# Patient Record
Sex: Male | Born: 1975 | Race: White | Hispanic: Yes | Marital: Single | Smoking: Never smoker
Health system: Southern US, Community
[De-identification: ages and names within clinical notes are randomized; demographics above are authoritative.]

## PROBLEM LIST (undated history)

## (undated) DIAGNOSIS — K219 Gastro-esophageal reflux disease without esophagitis: Secondary | ICD-10-CM

## (undated) HISTORY — DX: Gastro-esophageal reflux disease without esophagitis: K21.9

---

## 2005-11-04 ENCOUNTER — Ambulatory Visit: Payer: Self-pay | Admitting: Nurse Practitioner

## 2005-11-05 ENCOUNTER — Ambulatory Visit: Payer: Self-pay | Admitting: *Deleted

## 2005-11-15 ENCOUNTER — Emergency Department (HOSPITAL_COMMUNITY): Admission: EM | Admit: 2005-11-15 | Discharge: 2005-11-15 | Payer: Self-pay | Admitting: Emergency Medicine

## 2015-12-09 ENCOUNTER — Emergency Department (HOSPITAL_COMMUNITY): Payer: Self-pay

## 2015-12-09 ENCOUNTER — Encounter (HOSPITAL_COMMUNITY): Payer: Self-pay | Admitting: Family Medicine

## 2015-12-09 ENCOUNTER — Emergency Department (HOSPITAL_COMMUNITY)
Admission: EM | Admit: 2015-12-09 | Discharge: 2015-12-09 | Disposition: A | Discharge status: Home / Self Care | Payer: Self-pay | Attending: Emergency Medicine | Admitting: Emergency Medicine

## 2015-12-09 DIAGNOSIS — Y9289 Other specified places as the place of occurrence of the external cause: Secondary | ICD-10-CM | POA: Insufficient documentation

## 2015-12-09 DIAGNOSIS — W11XXXA Fall on and from ladder, initial encounter: Secondary | ICD-10-CM | POA: Insufficient documentation

## 2015-12-09 DIAGNOSIS — S92001A Unspecified fracture of right calcaneus, initial encounter for closed fracture: Secondary | ICD-10-CM | POA: Insufficient documentation

## 2015-12-09 DIAGNOSIS — S3992XA Unspecified injury of lower back, initial encounter: Secondary | ICD-10-CM | POA: Insufficient documentation

## 2015-12-09 DIAGNOSIS — R110 Nausea: Secondary | ICD-10-CM | POA: Insufficient documentation

## 2015-12-09 DIAGNOSIS — Y998 Other external cause status: Secondary | ICD-10-CM | POA: Insufficient documentation

## 2015-12-09 DIAGNOSIS — Y9389 Activity, other specified: Secondary | ICD-10-CM | POA: Insufficient documentation

## 2015-12-09 MED ORDER — OXYCODONE-ACETAMINOPHEN 5-325 MG PO TABS: 1.0000 | ORAL_TABLET | Freq: Four times a day (QID) | ORAL | Status: AC | PRN

## 2015-12-09 MED ORDER — ONDANSETRON 4 MG PO TBDP
4.0000 mg | ORAL_TABLET | Freq: Once | ORAL | Status: AC
  Administered 2015-12-09: 4 mg via ORAL
  Filled 2015-12-09: qty 1

## 2015-12-09 MED ORDER — OXYCODONE-ACETAMINOPHEN 5-325 MG PO TABS
1.0000 | ORAL_TABLET | Freq: Once | ORAL | Status: AC
  Administered 2015-12-09: 1 via ORAL
  Filled 2015-12-09: qty 1

## 2015-12-09 NOTE — ED Notes (Signed)
Pt here for fall from ladder about 10 feet landing on right leg. Pt here with ankle pain and swelling.sts also foot pain

## 2015-12-09 NOTE — Progress Notes (Signed)
Orthopedic Tech Progress Note Patient Details:  Charles Richardson 06/10/1976 [id number]  Ortho Devices Type of Ortho Device: Ace wrap, Post (short leg) splint, Crutches Ortho Device/Splint Interventions: Application   Saul FordyceJennifer C Tyron Manetta 12/09/2015, 4:23 PM

## 2015-12-09 NOTE — ED Notes (Signed)
Declined W/C at D/C and was escorted to lobby by RN. 

## 2015-12-09 NOTE — Discharge Instructions (Signed)
DO NOT PUT ANY WEIGHT ON YOUR FOOT! No walking on foot at all. Call the orthopedic physician listed first thing Monday morning to schedule your follow up appointment. Use pain medication as needed for severe pain - This can make you very drowsy - please do not drink or drive on this medication.

## 2015-12-09 NOTE — ED Provider Notes (Signed)
CSN: [id number]     Arrival date & time 12/09/15  1233 History  By signing my name below, I, Charles Richardson, attest that this documentation has been prepared under the direction and in the presence of [facility], PA-C. Electronically Signed: Placido Richardson, ED Scribe. 12/09/2015. 4:36 PM.   Chief Complaint  Patient presents with  . Fall  . Leg Injury   The history is provided by the patient and a relative. No language interpreter was used.    HPI Comments: Charles Richardson is a 40 y.o. male who is otherwise healthy presents to the Emergency Department complaining of a fall that occurred PTA. Pt was on a ladder and fell ~10 feet landing on his feet (mostly right foot) with resulting constant, moderate, right foot and ankle pain, mild nausea, mild lower back pain as well as mild left ankle pain which he states has since alleviated. His pain worsens with movement or palpation. He notes taking 6x OTC tylenol which provided short term pain relief. He denies numbness, right knee pain or any other associated symptoms at this time.   History reviewed. No pertinent past medical history. History reviewed. No pertinent past surgical history. History reviewed. No pertinent family history. Social History  Substance Use Topics  . Smoking status: Never Smoker   . Smokeless tobacco: None  . Alcohol Use: None    Review of Systems  Constitutional: Negative for fatigue.  Eyes: Negative for visual disturbance.  Respiratory: Negative for shortness of breath.   Cardiovascular: Negative for chest pain.  Gastrointestinal: Positive for nausea. Negative for vomiting and abdominal pain.  Genitourinary: Negative for difficulty urinating.  Musculoskeletal: Positive for back pain, joint swelling and arthralgias.  Skin: Negative for pallor.  Allergic/Immunologic: Negative for immunocompromised state.  Neurological: Negative for syncope and numbness.   Allergies  Review of patient's allergies indicates no  known allergies.  Home Medications   Prior to Admission medications   Medication Sig Start Date End Date Taking? Authorizing Provider  oxyCODONE-acetaminophen (PERCOCET/ROXICET) 5-325 MG tablet Take 1-2 tablets by mouth every 6 (six) hours as needed for severe pain. 12/09/15   Charles Pilcher Ward, PA-C   BP 115/77 mmHg  Pulse 63  Temp(Src) 97.7 F (36.5 C) (Oral)  Resp 20  SpO2 99%    Physical Exam  Constitutional: He is oriented to person, place, and time. He appears well-developed and well-nourished.  HENT:  Head: Normocephalic and atraumatic.  Eyes: EOM are normal.  Neck: Normal range of motion.  Cardiovascular: Normal rate, regular rhythm and normal heart sounds.  Exam reveals no gallop and no friction rub.   No murmur heard. Pulmonary/Chest: Effort normal and breath sounds normal. No respiratory distress. He has no wheezes. He has no rales.  Abdominal: Soft. He exhibits no distension. There is no tenderness.  Musculoskeletal:       Feet:  Right ankle with significant generalized swelling. No open wound. TTP as depicted in image. Decreased ROM 2/2 pain. Sensation intact and 2+ distal pulse.  Mild tenderness to palpation across low back. Paraspinal > midline. No overlying skin changes.   Neurological: He is alert and oriented to person, place, and time.  Skin: Skin is warm and dry.  Psychiatric: He has a normal mood and affect.  Nursing note and vitals reviewed.   ED Course  Procedures  DIAGNOSTIC STUDIES: Oxygen Saturation is 99% on RA, normal by my interpretation.    COORDINATION OF CARE: 1:39 PM Discussed next steps with pt. He verbalized understanding  and is agreeable with the plan.   Labs Review Labs Reviewed - No data to display  Imaging Review Dg Lumbar Spine Complete  12/09/2015  CLINICAL DATA:  Pt was painting and on a ladder and fell APPROX.10 feet landing on his feet with resulting Mild to lower lumbar back pain Initial encounter. EXAM: LUMBAR SPINE -  COMPLETE 4+ VIEW COMPARISON:  None. FINDINGS: There is no evidence of lumbar spine fracture. Alignment is normal. Intervertebral disc spaces are maintained. No evidence of facet arthropathy or other bone lesions. IMPRESSION: Negative lumbar spine radiographs. Electronically Signed   By: Myles RosenthalJohn  Stahl M.D.   On: 12/09/2015 14:40   Dg Ankle Complete Right  12/09/2015  CLINICAL DATA:  40 year old male status post fall from a ladder onto his heel (approximately 10 feet). EXAM: RIGHT ANKLE - COMPLETE 3+ VIEW COMPARISON:  Concurrently obtained radiographs of the right foot FINDINGS: Extensive soft tissue swelling about the ankle. No evidence of acute fracture involving the distal tibia, fibula or talus. However, there is a complex and slightly impacted fracture of the calcaneus. The fracture line extends to the subtalar joint articular surface. No significant displacement. No ankle joint effusion. IMPRESSION: Comminuted and slightly impacted calcaneal fracture with extension to the subtalar joint articular surface. Electronically Signed   By: Malachy MoanHeath  McCullough M.D.   On: 12/09/2015 13:25   Ct Foot Right Wo Contrast  12/09/2015  CLINICAL DATA:  Status post 10 foot fall from a ladder today with a right calcaneus fracture. Initial encounter. EXAM: CT OF THE RIGHT FOOT WITHOUT CONTRAST TECHNIQUE: Multidetector CT imaging of the right foot was performed according to the standard protocol. Multiplanar CT image reconstructions were also generated. COMPARISON:  Plain films the right ankle earlier today. FINDINGS: The patient has a comminuted fracture of the calcaneus. The main fracture line extends from the dorsal calcaneus posterior to the subtalar joint in an inferior and oblique orientation through the midbody of the calcaneus. This component of the fracture is distracted approximately 1.2 cm. The lateral cortex of the body of the calcaneus is buckled. Only the inferior most margin of the posterior facet is involved in the  fracture. The bulk of the posterior facet is preserved. The sustentacular tali is a separate fragment and a fracture line extends to the mid substance of the middle facet with depression of the articular surface of approximately 0.2 cm. The lateral margin of the middle facet is widened. The fracture also extends to the medial periphery of the calcaneocuboid joint. The anterior process of the calcaneus is a separate fragment. No other fracture is identified. Bones are osteopenic. Extensive hematoma is present about the ankle. The peroneus longus tendon passes immediately adjacent to the lateral fracture of the calcaneus but no definite entrapment is seen. No obvious ligament tear seen. IMPRESSION: Comminuted calcaneus fracture does not fit into the [employer] classification as only a minimal portion of the posterior facet of the subtalar joint is involved. The fracture does extend through the articular surface of the middle facet and the sustentaculum and anterior process of the calcaneus are separate fragments. The peroneus longus passes along the fracture line through the lateral aspect of the calcaneus but does not definitely appear entrapped. Electronically Signed   By: Drusilla Kannerhomas  Dalessio M.D.   On: 12/09/2015 16:21   Dg Foot Complete Right  12/09/2015  CLINICAL DATA:  40 year old male status post fall from 10 foot ladder onto his heel EXAM: RIGHT FOOT COMPLETE - 3+ VIEW COMPARISON:  Concurrently obtained  radiographs of the ankle FINDINGS: Comminuted and slightly impacted calcaneal fracture. Fracture line extends to the subtalar joint surface. The surrounding bones appear intact. No significant displacement. IMPRESSION: Comminuted and slightly impacted calcaneal fracture with extension to the subtalar articular surface. Electronically Signed   By: Malachy Moan M.D.   On: 12/09/2015 13:27   I have personally reviewed and evaluated these images as part of my medical decision-making.   EKG  Interpretation None      MDM   Final diagnoses:  Calcaneal fracture, right, closed, initial encounter   Jencarlo Bonadonna presents to ED after fall from approx. 10 ft ladder landing on feet. Main complaint at this time is right foot pain. X-rays obtained which show a comminuted and slightly impacted calcaneal fracture. Lumbar imaging also obtained which was unremarkable. Consulted ortho, Dr. Linna Caprice, who recommends obtaining a CT of the foot. Place in a short leg posterior splint and follow up with Dr. Victorino Dike this week. CT foot obtained - see report above. Splint placed, crutches given, follow up information discussed, home care discussed. Strict non-weightbearing discussed. All questions answered.   Patient seen by and discussed with Dr. Denton Lank who agrees with treatment plan.   I personally performed the services described in this documentation, which was scribed in my presence. The recorded information has been reviewed and is accurate.    [facility], PA-C 12/09/15 1636  Cathren Laine, MD 12/10/15 [phone]

## 2017-06-02 IMAGING — DX DG LUMBAR SPINE COMPLETE 4+V
5 series · 5 of 5 positions shown · non-contrast
Comparison: None.

CLINICAL DATA: Pt was painting and on a ladder and fell APPROX.10
feet landing on his feet with resulting Mild to lower lumbar back
pain Initial encounter.

EXAM:
LUMBAR SPINE - COMPLETE 4+ VIEW

[l-spine ap]
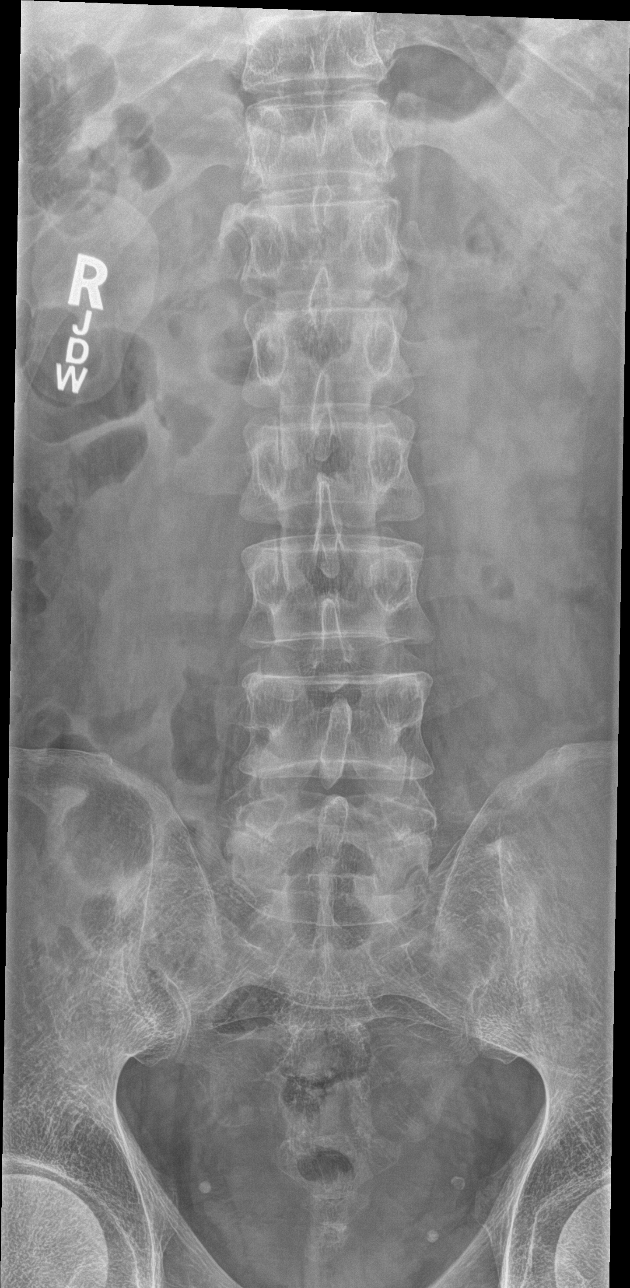

[l-spine obl (1 of 2)]
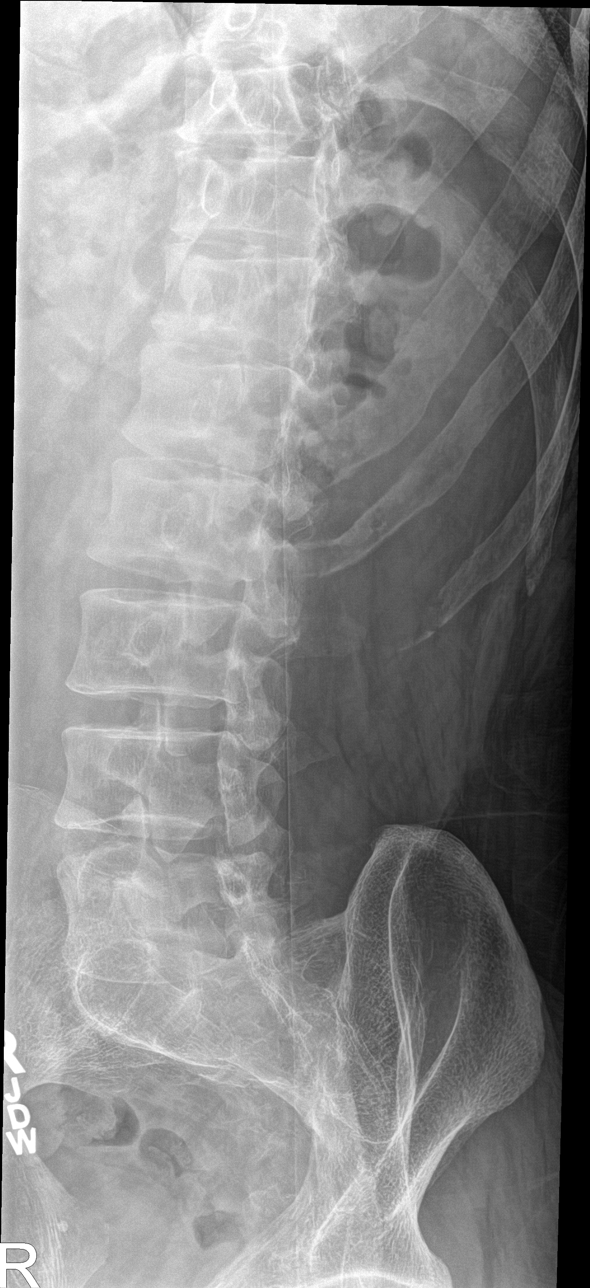

[l-spine obl (2 of 2)]
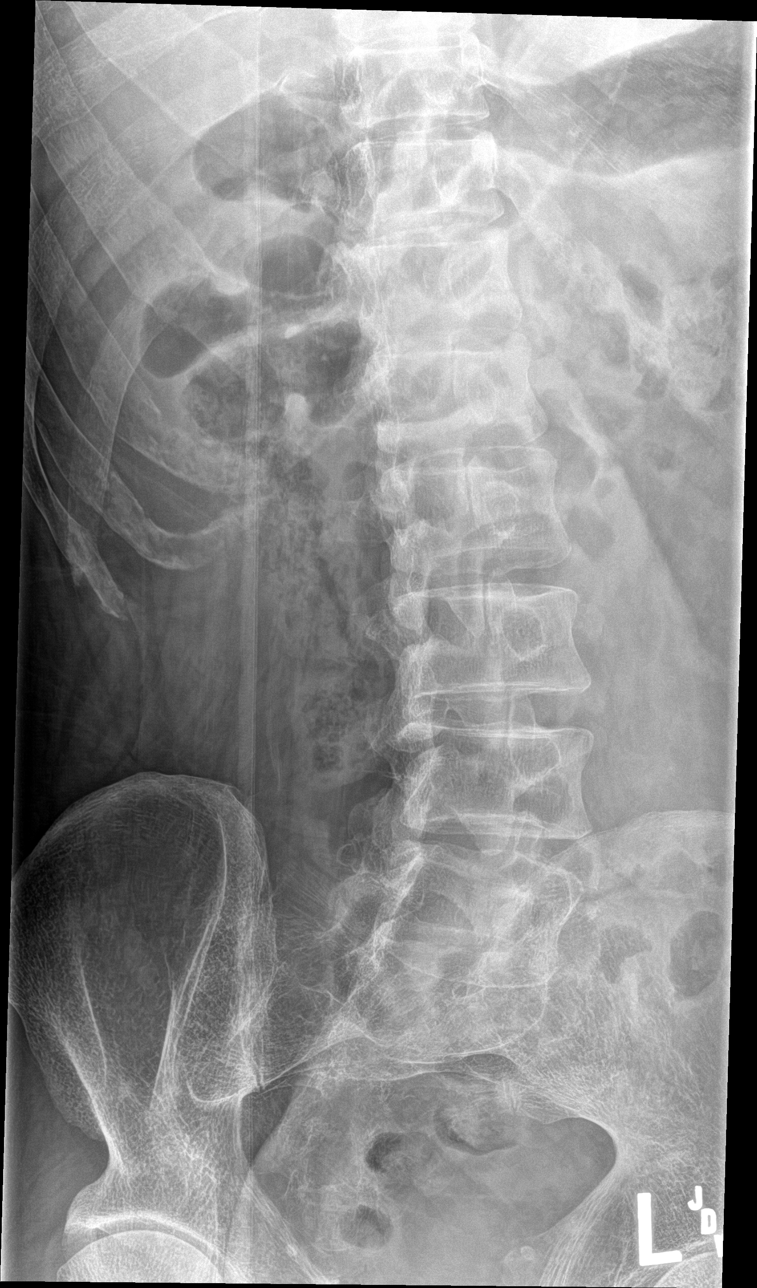

[l-spine lat]
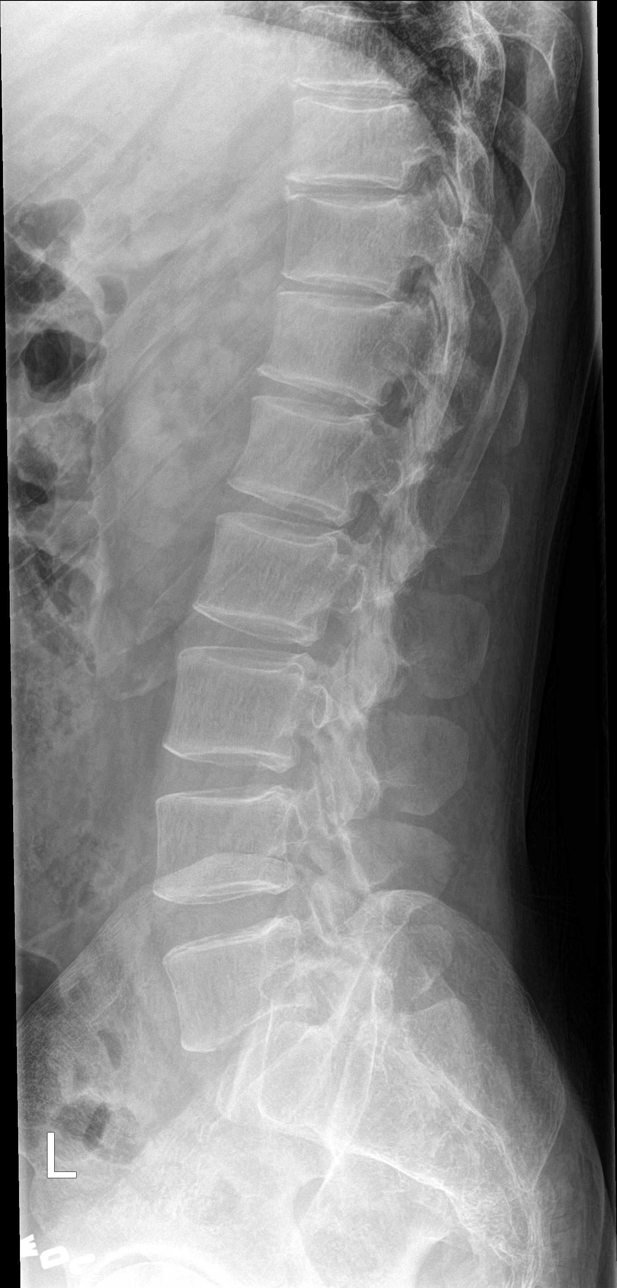

[l-spine spot]
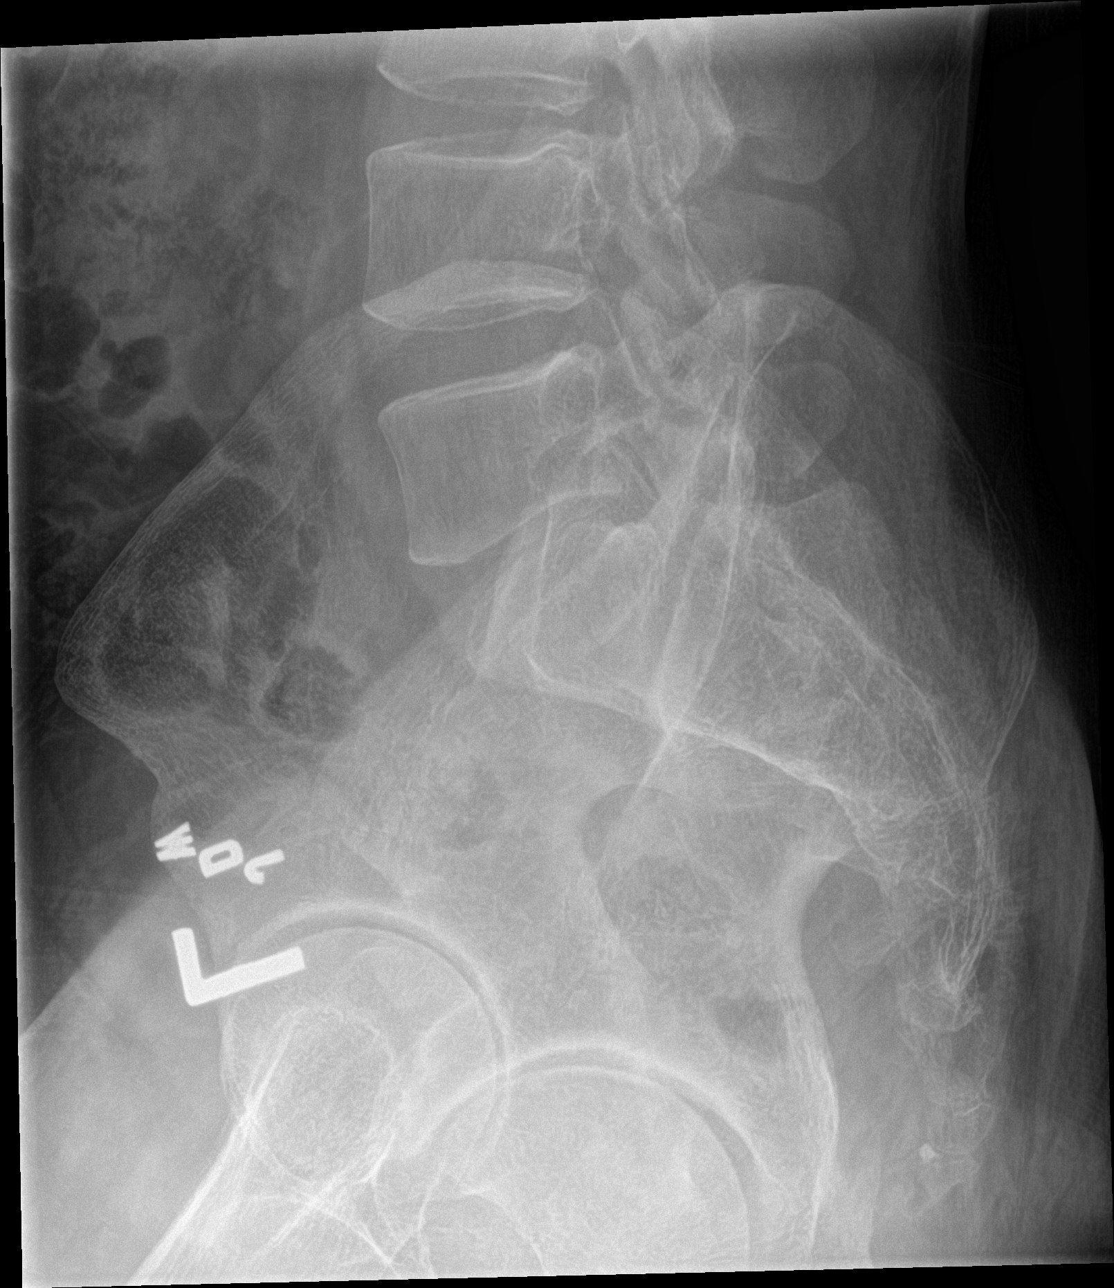

[5 of 5 positions shown; findings below may reference images not displayed]

FINDINGS: There is no evidence of lumbar spine fracture. Alignment is normal.
Intervertebral disc spaces are maintained. No evidence of facet
arthropathy or other bone lesions.
IMPRESSION: Negative lumbar spine radiographs.

## 2017-06-02 IMAGING — CT CT FOOT*R* W/O CM
2 series · 12 of 27 positions shown, 15 images · non-contrast
Comparison: Plain films the right ankle earlier today.

CLINICAL DATA: Status post 10 foot fall from a ladder today with a
right calcaneus fracture. Initial encounter.

EXAM:
CT OF THE RIGHT FOOT WITHOUT CONTRAST
TECHNIQUE: Multidetector CT imaging of the right foot was performed according
to the standard protocol. Multiplanar CT image reconstructions were
also generated.

[Series 4: lower ext 1.5 st · axial · 0.41mm/px · z∈[-266,-93]mm · 7 of 137 slices shown, 9 images]
[im 11/137  soft-tissue]
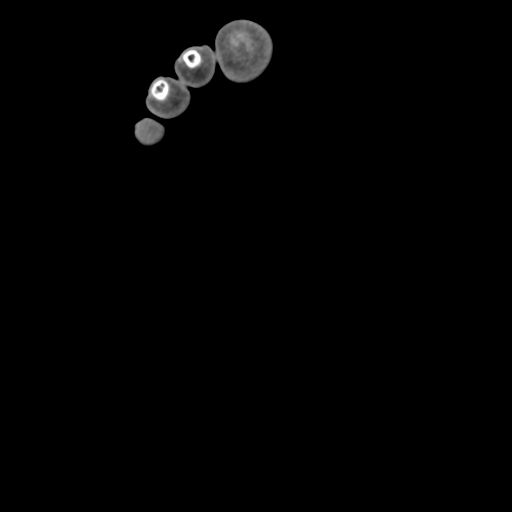
[im 11/137  bone]
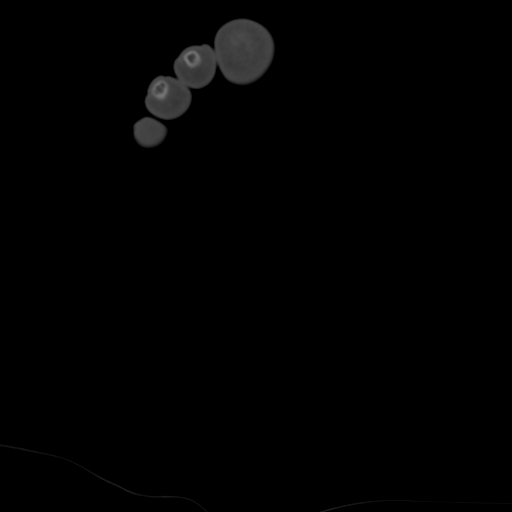
[im 32/137  bone]
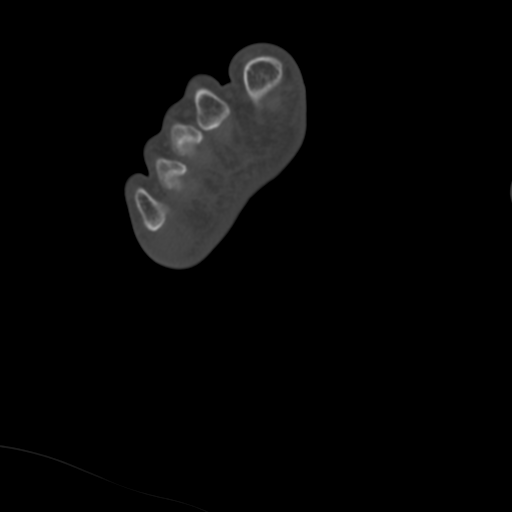
[im 53/137  bone]
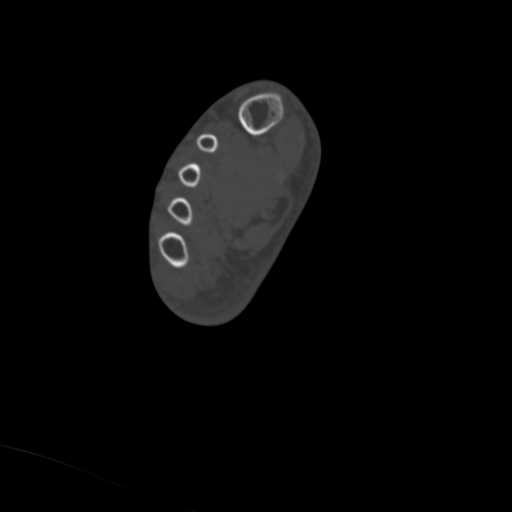
[im 74/137  bone]
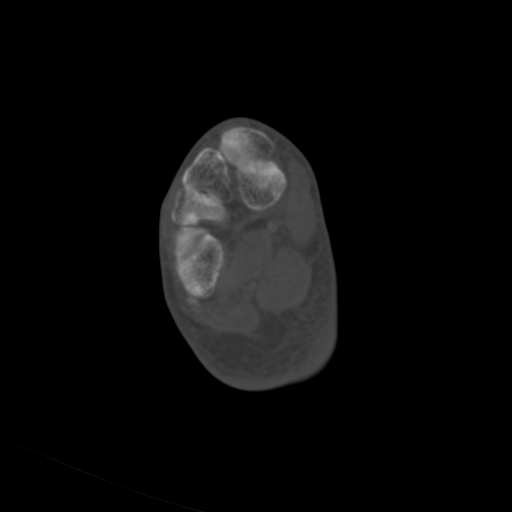
[im 84/137  soft-tissue]
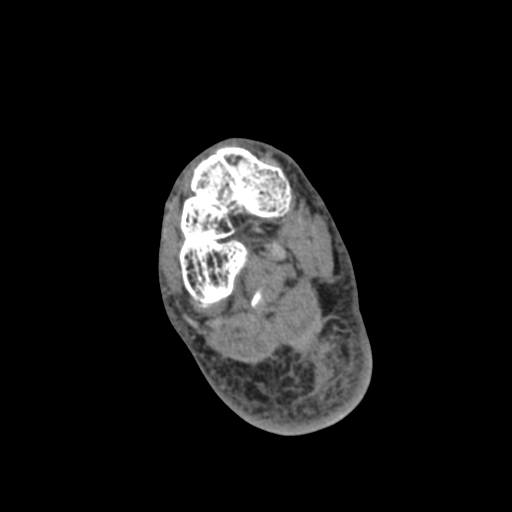
[im 84/137  bone]
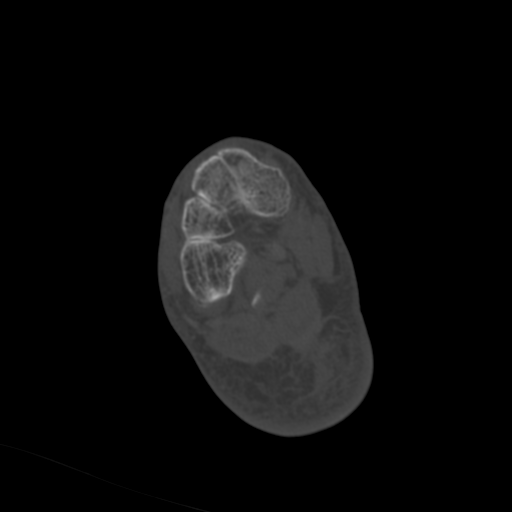
[im 105/137  bone]
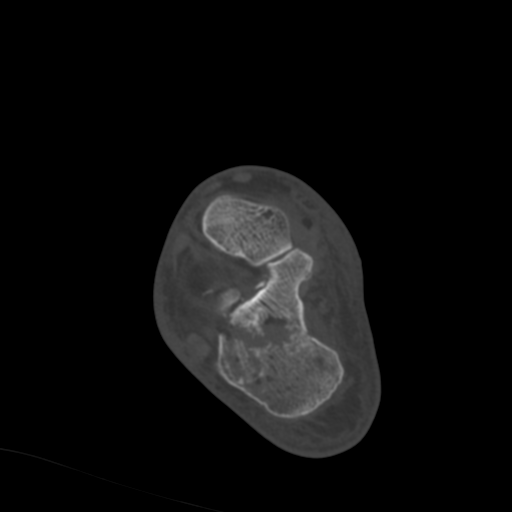
[im 126/137  bone]
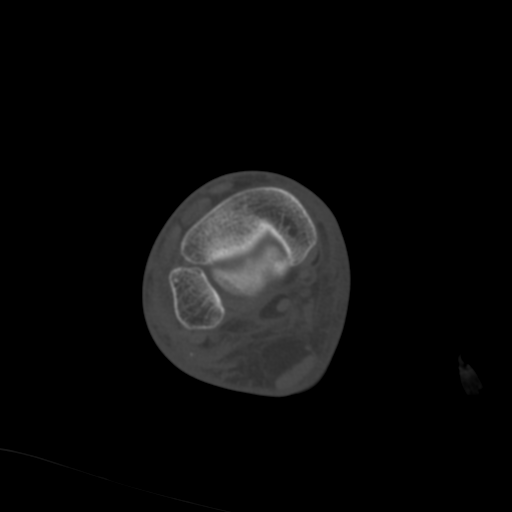

[Series 10: lower ext sag st · sagittal · 0.29mm/px · 5 of 71 slices shown, 6 images]
[im 24/71  bone]
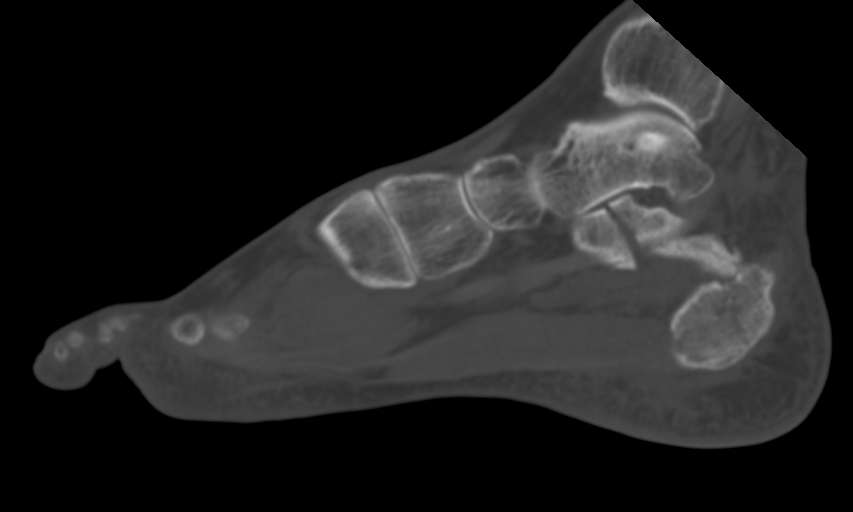
[im 30/71  bone]
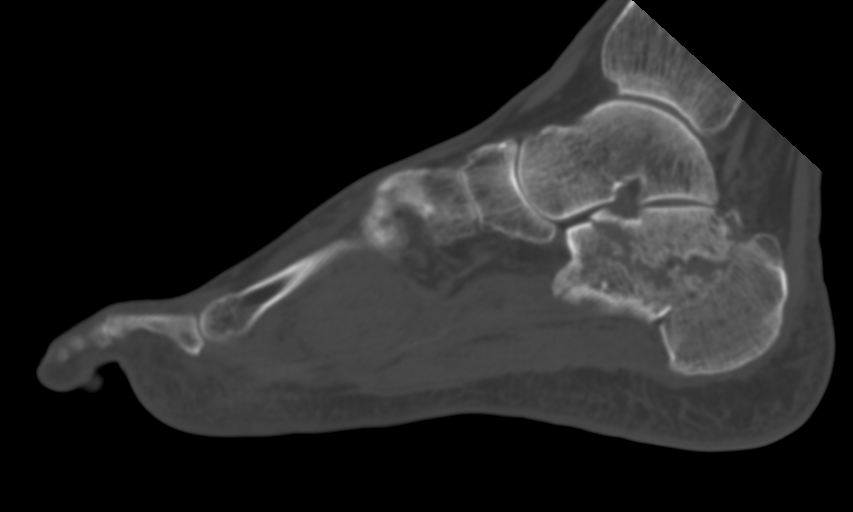
[im 36/71  soft-tissue]
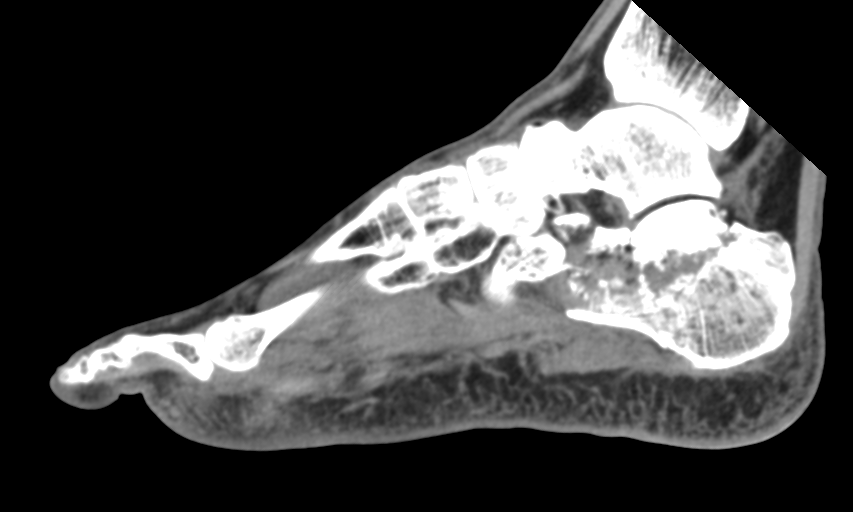
[im 36/71  bone]
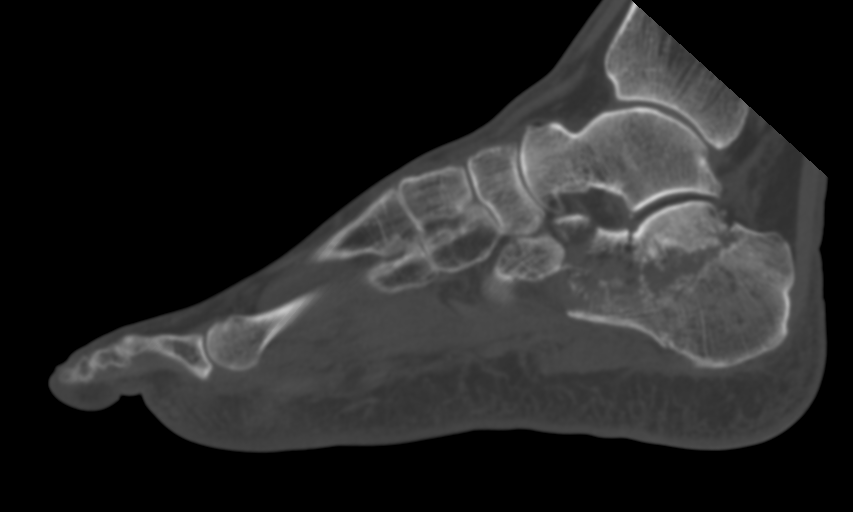
[im 41/71  bone]
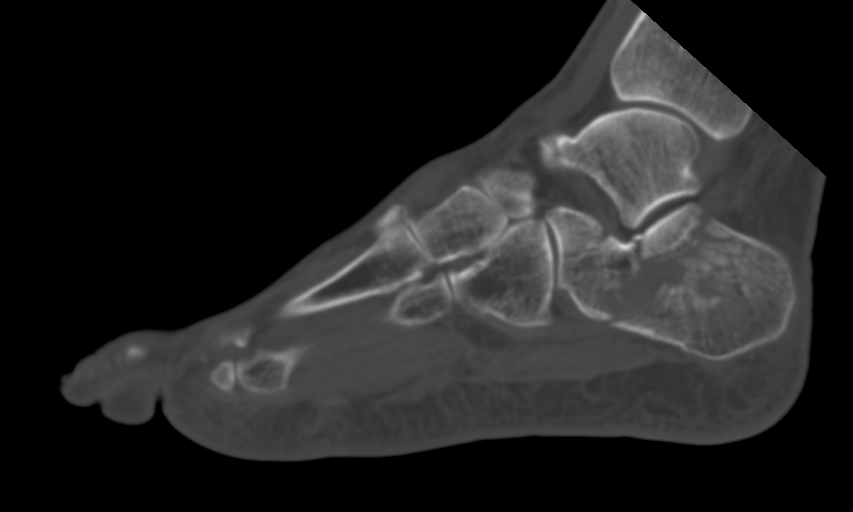
[im 47/71  bone]
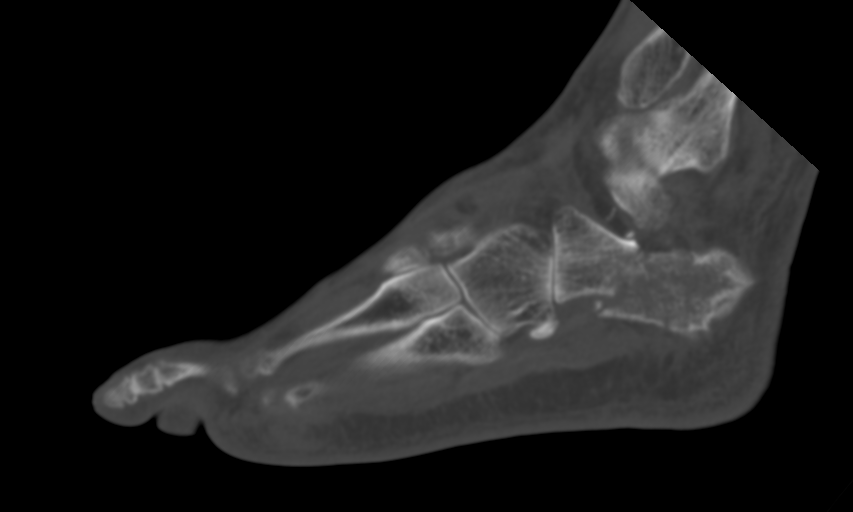

[12 of 27 positions shown; findings below may reference images not displayed]

FINDINGS: The patient has a comminuted fracture of the calcaneus. The main
fracture line extends from the dorsal calcaneus posterior to the
subtalar joint in an inferior and oblique orientation through the
midbody of the calcaneus. This component of the fracture is
distracted approximately 1.2 cm. The lateral cortex of the body of
the calcaneus is buckled.

Only the inferior most margin of the posterior facet is involved in
the fracture. The bulk of the posterior facet is preserved. The
sustentacular tali is a separate fragment and a fracture line
extends to the mid substance of the middle facet with depression of
the articular surface of approximately 0.2 cm. The lateral margin of
the middle facet is widened. The fracture also extends to the medial
periphery of the calcaneocuboid joint. The anterior process of the
calcaneus is a separate fragment.

No other fracture is identified. Bones are osteopenic. Extensive
hematoma is present about the ankle. The peroneus longus tendon
passes immediately adjacent to the lateral fracture of the calcaneus
but no definite entrapment is seen. No obvious ligament tear seen.
IMPRESSION: Comminuted calcaneus fracture does not fit into the [employer]
classification as only a minimal portion of the posterior facet of
the subtalar joint is involved. The fracture does extend through the
articular surface of the middle facet and the sustentaculum and
anterior process of the calcaneus are separate fragments.

The peroneus longus passes along the fracture line through the
lateral aspect of the calcaneus but does not definitely appear
entrapped.

## 2017-06-02 IMAGING — CR DG ANKLE COMPLETE 3+V*R*
3 series · 3 of 3 positions shown · non-contrast
Comparison: Concurrently obtained radiographs of the right foot

CLINICAL DATA: 39-year-old male status post fall from a ladder onto
his heel (approximately 10 feet).

EXAM:
RIGHT ANKLE - COMPLETE 3+ VIEW

[ankle ap]
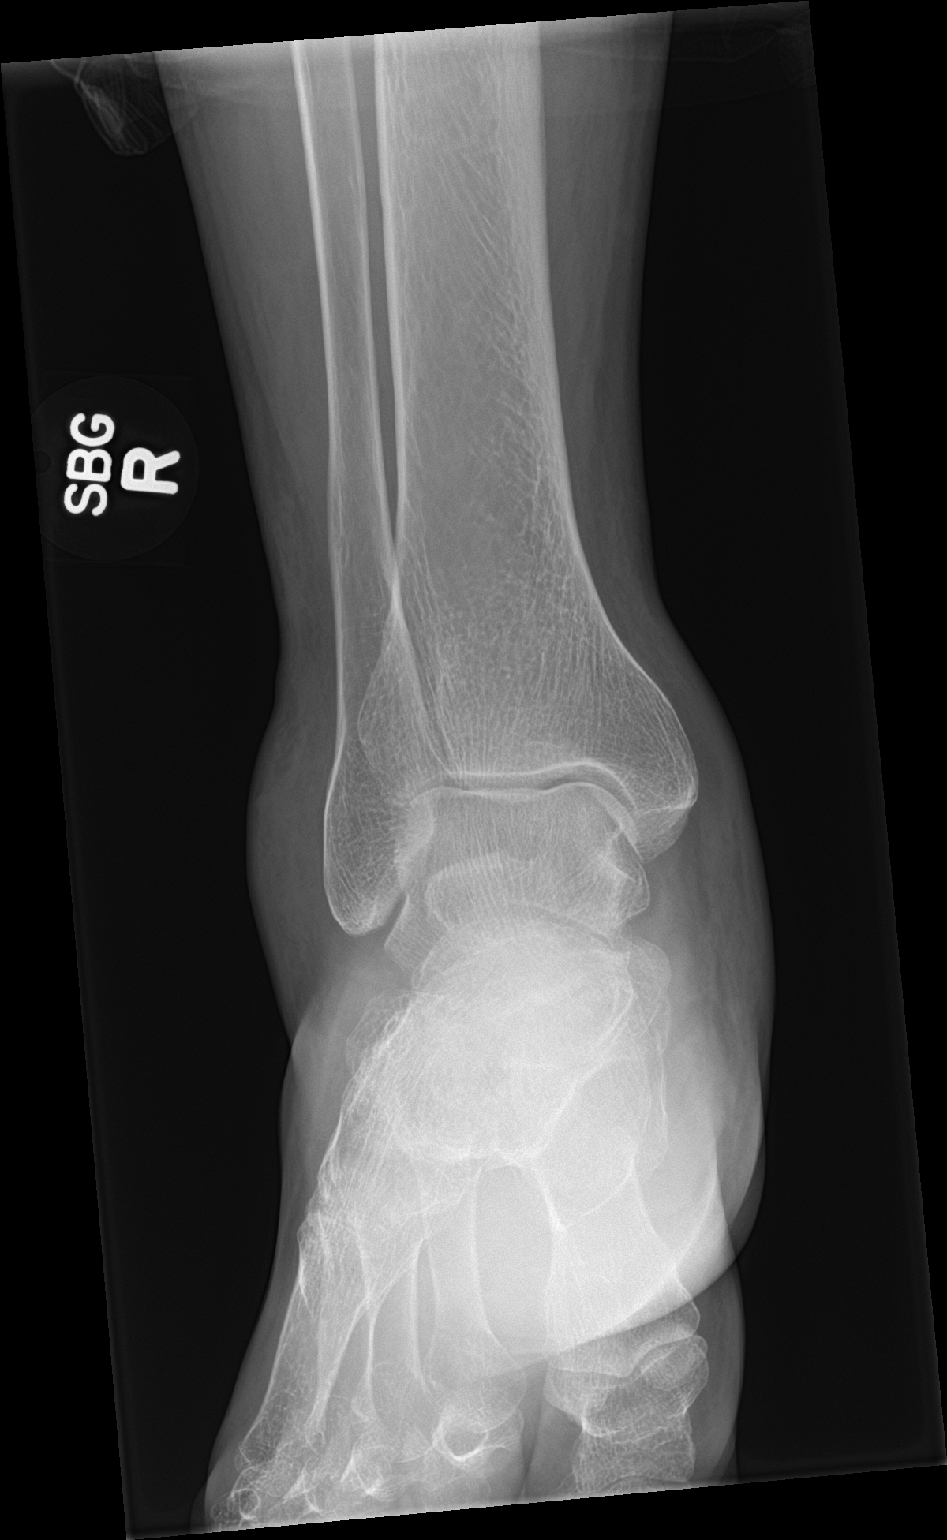

[ankle obl]
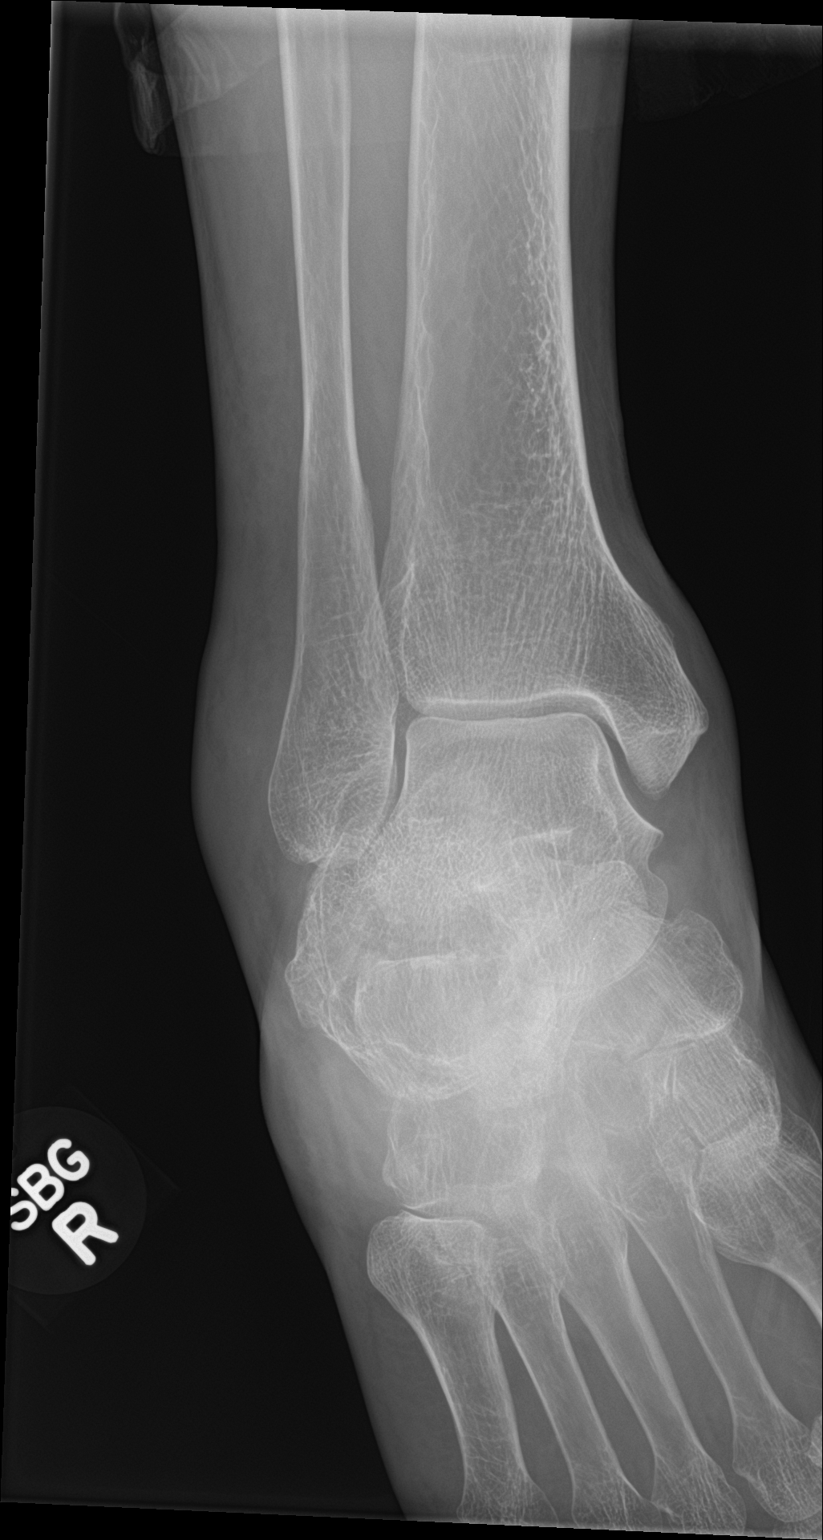

[ankle lat]
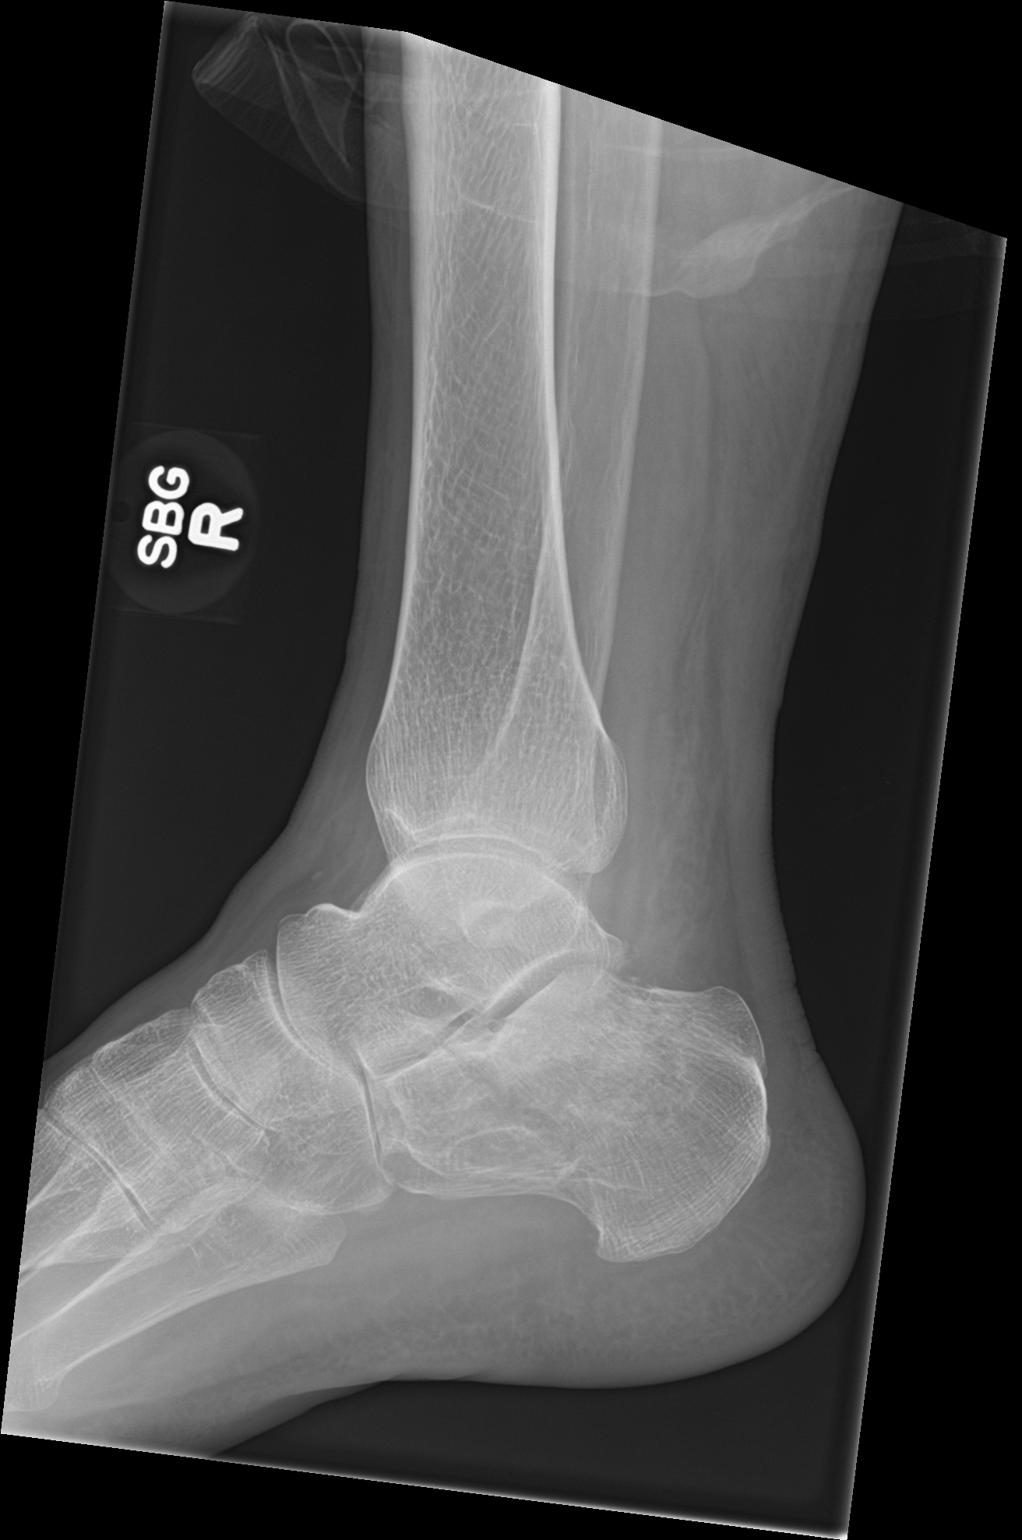

[3 of 3 positions shown; findings below may reference images not displayed]

FINDINGS: Extensive soft tissue swelling about the ankle. No evidence of acute
fracture involving the distal tibia, fibula or talus. However, there
is a complex and slightly impacted fracture of the calcaneus. The
fracture line extends to the subtalar joint articular surface. No
significant displacement. No ankle joint effusion.
IMPRESSION: Comminuted and slightly impacted calcaneal fracture with extension
to the subtalar joint articular surface.

## 2019-07-21 ENCOUNTER — Other Ambulatory Visit: Payer: Self-pay

## 2019-07-21 DIAGNOSIS — Z20822 Contact with and (suspected) exposure to covid-19: Secondary | ICD-10-CM

## 2019-07-23 LAB — NOVEL CORONAVIRUS, NAA: SARS-CoV-2, NAA: NOT DETECTED

## 2021-01-18 ENCOUNTER — Encounter: Payer: Self-pay | Admitting: Gastroenterology

## 2021-02-06 ENCOUNTER — Ambulatory Visit: Payer: Self-pay | Admitting: Gastroenterology
